# Patient Record
Sex: Female | Born: 2000 | Race: Black or African American | Hispanic: No | Marital: Single | Smoking: Never smoker
Health system: Southern US, Community
[De-identification: ages and names within clinical notes are randomized; demographics above are authoritative.]

## PROBLEM LIST (undated history)

## (undated) ENCOUNTER — Emergency Department (HOSPITAL_COMMUNITY): Discharge status: Absent without leave | Payer: Medicaid Other

## (undated) DIAGNOSIS — I10 Essential (primary) hypertension: Secondary | ICD-10-CM

## (undated) DIAGNOSIS — R040 Epistaxis: Secondary | ICD-10-CM

## (undated) HISTORY — DX: Essential (primary) hypertension: I10

---
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
## 2019-05-06 ENCOUNTER — Ambulatory Visit: Payer: Medicaid Other | Attending: Internal Medicine

## 2019-05-06 DIAGNOSIS — Z23 Encounter for immunization: Secondary | ICD-10-CM

## 2019-05-06 NOTE — Progress Notes (Signed)
   Covid-19 Vaccination Clinic  Name:  Danielle Dunlap    MRN: [id number] DOB: 2000-12-27  05/06/2019  [name] was observed post Covid-19 immunization for 15 minutes without incident. She was provided with Vaccine Information Sheet and instruction to access the V-Safe system.   [name] was instructed to call 911 with any severe reactions post vaccine: Marland Kitchen Difficulty breathing  . Swelling of face and throat  . A fast heartbeat  . A bad rash all over body  . Dizziness and weakness   Immunizations Administered    Name Date Dose VIS Date Route   Pfizer COVID-19 Vaccine 05/06/2019  2:59 PM 0.3 mL 01/21/2019 Intramuscular   Manufacturer: [employer], [employer]   Lot: [id number]   NDC: [address]-9

## 2019-05-31 ENCOUNTER — Ambulatory Visit: Payer: Medicaid Other | Attending: Internal Medicine

## 2019-05-31 DIAGNOSIS — Z23 Encounter for immunization: Secondary | ICD-10-CM

## 2019-05-31 NOTE — Progress Notes (Signed)
   Covid-19 Vaccination Clinic  Name:  Danielle Dunlap    MRN: [id number] DOB: [birth year]  05/31/2019  [name] was observed post Covid-19 immunization for 15 minutes without incident. She was provided with Vaccine Information Sheet and instruction to access the V-Safe system.   [name] was instructed to call 911 with any severe reactions post vaccine: Marland Kitchen Difficulty breathing  . Swelling of face and throat  . A fast heartbeat  . A bad rash all over body  . Dizziness and weakness   Immunizations Administered    Name Date Dose VIS Date Route   Pfizer COVID-19 Vaccine 05/31/2019  3:17 PM 0.3 mL 04/06/2018 Intramuscular   Manufacturer: [employer], [employer]   Lot: [id number]   NDC: [address]-2

## 2022-07-09 ENCOUNTER — Ambulatory Visit (HOSPITAL_COMMUNITY)
Admission: EM | Admit: 2022-07-09 | Discharge: 2022-07-09 | Disposition: A | Discharge status: Home / Self Care | Payer: Commercial Managed Care - PPO | Attending: Internal Medicine | Admitting: Internal Medicine

## 2022-07-09 ENCOUNTER — Encounter (HOSPITAL_COMMUNITY): Payer: Self-pay

## 2022-07-09 DIAGNOSIS — R030 Elevated blood-pressure reading, without diagnosis of hypertension: Secondary | ICD-10-CM

## 2022-07-09 DIAGNOSIS — U071 COVID-19: Secondary | ICD-10-CM | POA: Diagnosis not present

## 2022-07-09 MED ORDER — GUAIFENESIN ER 1200 MG PO TB12: 1200.0000 mg | ORAL_TABLET | Freq: Two times a day (BID) | ORAL | 0 refills | Status: DC

## 2022-07-09 MED ORDER — BENZONATATE 100 MG PO CAPS: 100.0000 mg | ORAL_CAPSULE | Freq: Three times a day (TID) | ORAL | 0 refills | Status: DC

## 2022-07-09 MED ORDER — MOLNUPIRAVIR EUA 200MG CAPSULE: 4.0000 | ORAL_CAPSULE | Freq: Two times a day (BID) | ORAL | 0 refills | Status: AC

## 2022-07-09 NOTE — Discharge Instructions (Signed)
You tested positive for COVID-19 this morning. No need to quarantine, however you may return to work once you have been fever free for 24 hours and once you are feeling better. Your work note to return on June 1 is at the back of your packet.  Take molnupiravir antiviral as prescribed.  This will help to lessen the severity and length of your symptoms.  Use the following medicines to help with symptoms: - Plain Mucinex (guaifenesin) over the counter as directed every 12 hours to thin mucous so that you are able to get it out of your body easier. Drink plenty of water while taking this medication so that it works well in your body (at least 8 cups a day).  - Tylenol 1,000mg  and/or ibuprofen 600mg  every 6 hours with food as needed for aches/pains or fever/chills.  - Tessalon perles every 8 hours as needed for cough.  1 tablespoon of honey in warm water and/or salt water gargles may also help with symptoms. Humidifier to your room will help add water to the air and reduce coughing.  If you develop any new or worsening symptoms, please return.  If your symptoms are severe, please go to the emergency room.  Follow-up with your primary care provider for further evaluation and management of your symptoms as well as ongoing wellness visits.  I hope you feel better!

## 2022-07-09 NOTE — ED Triage Notes (Signed)
Pt c/o nasal congestion  Reports covid(+) test at home early this morning

## 2022-07-09 NOTE — ED Provider Notes (Signed)
[facility]    CSN: [id number] Arrival date & time: 07/09/22  1938      History   Chief Complaint Chief Complaint  Patient presents with   Covid Positive    HPI Danielle Dunlap is a 22 y.o. female.   Patient presents to urgent care for evaluation of nasal congestion, generalized fatigue, and fever/chills that started yesterday.  Max temp at home was 99.8 per patient's mother at the bedside.  She took a COVID-19 test at home this morning and it was positive.  No recent known sick contacts with similar symptoms.  Denies history of COVID-19 infection in the last 90 days to her knowledge.  She is not currently experiencing any cough, shortness of breath, chest pain, nausea, vomiting, diarrhea, abdominal pain, rash, sore throat, ear pain, headache, or decreased appetite.  Never smoker, denies drug use.  Denies history of chronic respiratory problems and chronic medical problems.  Denies chance of pregnancy last menstrual cycle was this month.  She has been taking ibuprofen over-the-counter regularly to help with fever, chills, and inflammation related to viral illness.   Patient's blood pressure is very elevated at 170/120 on initial check 182/110 (manual blood pressure) on recheck.  She is not experiencing headache, dizziness, chest pain, shortness of breath, paresthesias, unilateral weakness, abdominal pain, dizziness, vision changes, or photophobia.  She has never had an MI or stroke in the past.  Reports family history of high blood pressure.  She has had elevated blood pressure readings in the past, however never this high.  Mom is very concerned about elevated blood pressure and states that when she had COVID-19 "her blood pressure also shot up".  Mom is requesting assistance in finding a primary care provider for patient as she has not had a primary care visit in 5 years for routine screening testing.      Past Medical History:  Diagnosis Date   Nosebleed     Patient Active  Problem List   Diagnosis Date Noted   Irregular menses 09/13/2014    Past Surgical History:  Procedure Laterality Date   TYMPANOSTOMY TUBE PLACEMENT  2006    OB History     Gravida  0   Para  0   Term  0   Preterm  0   AB  0   Living  0      SAB  0   IAB  0   Ectopic  0   Multiple  0   Live Births  0            Home Medications    Prior to Admission medications   Medication Sig Start Date End Date Taking? Authorizing Provider  ibuprofen (ADVIL,MOTRIN) 400 MG tablet Take 400 mg by mouth every 6 (six) hours as needed for moderate pain.    [provider]  norgestimate-ethinyl estradiol (ORTHO-CYCLEN,SPRINTEC,PREVIFEM) 0.25-35 MG-MCG tablet Take 1 tablet by mouth daily. Take 2 active pills daily for first week, then one active pill daily for the rest of three packs. 03/10/17   Tereso Newcomer, MD    Family History History reviewed. No pertinent family history.  Social History Social History   Tobacco Use   Smoking status: Never   Smokeless tobacco: Never  Substance Use Topics   Alcohol use: No   Drug use: No     Allergies   Patient has no known allergies.   Review of Systems Review of Systems Per HPI  Physical Exam Triage Vital  Signs ED Triage Vitals  Enc Vitals Group     BP 07/09/22 1951 (!) 170/120     Pulse Rate 07/09/22 1950 94     Resp 07/09/22 1950 18     Temp 07/09/22 1950 98.2 F (36.8 C)     Temp Source 07/09/22 1950 Oral     SpO2 07/09/22 1950 97 %     Weight --      Height --      Head Circumference --      Peak Flow --      Pain Score 07/09/22 1950 0     Pain Loc --      Pain Edu? --      Excl. in GC? --    No data found.  Updated Vital Signs BP (!) 170/120   Pulse 94   Temp 98.2 F (36.8 C) (Oral)   Resp 18   SpO2 97%   Visual Acuity Right Eye Distance:   Left Eye Distance:   Bilateral Distance:    Right Eye Near:   Left Eye Near:    Bilateral Near:     Physical Exam Vitals and nursing  note reviewed.  Constitutional:      Appearance: She is not ill-appearing or toxic-appearing.  HENT:     Head: Normocephalic and atraumatic.     Right Ear: Hearing and external ear normal.     Left Ear: Hearing and external ear normal.     Nose: Nose normal.     Mouth/Throat:     Lips: Pink.  Eyes:     General: Lids are normal. Vision grossly intact. Gaze aligned appropriately.     Extraocular Movements: Extraocular movements intact.     Conjunctiva/sclera: Conjunctivae normal.  Cardiovascular:     Rate and Rhythm: Normal rate and regular rhythm.     Heart sounds: Normal heart sounds, S1 normal and S2 normal.  Pulmonary:     Effort: Pulmonary effort is normal. No respiratory distress.     Breath sounds: Normal breath sounds and air entry.  Musculoskeletal:     Cervical back: Neck supple.  Skin:    General: Skin is warm and dry.     Capillary Refill: Capillary refill takes less than 2 seconds.     Findings: No rash.  Neurological:     General: No focal deficit present.     Mental Status: She is alert and oriented to person, place, and time. Mental status is at baseline.     Cranial Nerves: No dysarthria or facial asymmetry.  Psychiatric:        Mood and Affect: Mood normal.        Speech: Speech normal.        Behavior: Behavior normal.        Thought Content: Thought content normal.        Judgment: Judgment normal.      UC Treatments / Results  Labs (all labs ordered are listed, but only abnormal results are displayed) Labs Reviewed - No data to display  EKG   Radiology No results found.  Procedures Procedures (including critical care time)  Medications Ordered in UC Medications - No data to display  Initial Impression / Assessment and Plan / UC Course  I have reviewed the triage vital signs and the nursing notes.  Pertinent labs & imaging results that were available during my care of the patient were reviewed by me and considered in my medical decision  making (see chart for  details).     *** Final Clinical Impressions(s) / UC Diagnoses   Final diagnoses:  None   Discharge Instructions   None    ED Prescriptions   None    PDMP not reviewed this encounter.

## 2022-07-23 ENCOUNTER — Other Ambulatory Visit: Payer: Self-pay

## 2022-07-23 ENCOUNTER — Emergency Department (HOSPITAL_COMMUNITY): Payer: Commercial Managed Care - PPO

## 2022-07-23 ENCOUNTER — Emergency Department (HOSPITAL_COMMUNITY)
Admission: EM | Admit: 2022-07-23 | Discharge: 2022-07-23 | Disposition: A | Discharge status: Home / Self Care | Payer: Commercial Managed Care - PPO | Attending: Emergency Medicine | Admitting: Emergency Medicine

## 2022-07-23 DIAGNOSIS — R42 Dizziness and giddiness: Secondary | ICD-10-CM | POA: Insufficient documentation

## 2022-07-23 DIAGNOSIS — I10 Essential (primary) hypertension: Secondary | ICD-10-CM | POA: Diagnosis present

## 2022-07-23 DIAGNOSIS — Z79899 Other long term (current) drug therapy: Secondary | ICD-10-CM | POA: Insufficient documentation

## 2022-07-23 DIAGNOSIS — Z8616 Personal history of COVID-19: Secondary | ICD-10-CM | POA: Diagnosis not present

## 2022-07-23 LAB — COMPREHENSIVE METABOLIC PANEL
ALT: 18 U/L (ref 0–44)
AST: 21 U/L (ref 15–41)
Albumin: 3.6 g/dL (ref 3.5–5.0)
Alkaline Phosphatase: 79 U/L (ref 38–126)
Anion gap: 12 (ref 5–15)
BUN: 16 mg/dL (ref 6–20)
CO2: 23 mmol/L (ref 22–32)
Calcium: 9.3 mg/dL (ref 8.9–10.3)
Chloride: 100 mmol/L (ref 98–111)
Creatinine, Ser: 0.83 mg/dL (ref 0.44–1.00)
GFR, Estimated: >60 mL/min (ref >60)
Glucose, Bld: 87 mg/dL (ref 70–99)
Potassium: 3.3 mmol/L — ABNORMAL LOW (ref 3.5–5.1)
Sodium: 135 mmol/L (ref 135–145)
Total Bilirubin: 0.5 mg/dL (ref 0.3–1.2)
Total Protein: 7.5 g/dL (ref 6.5–8.1)

## 2022-07-23 LAB — CBC WITH DIFFERENTIAL/PLATELET
Abs Immature Granulocytes: 0.01 10*3/uL (ref 0.00–0.07)
Basophils Absolute: 0 10*3/uL (ref 0.0–0.1)
Basophils Relative: 1 %
Eosinophils Absolute: 0.2 10*3/uL (ref 0.0–0.5)
Eosinophils Relative: 2 %
HCT: 35.3 % — ABNORMAL LOW (ref 36.0–46.0)
Hemoglobin: 9.9 g/dL — ABNORMAL LOW (ref 12.0–15.0)
Immature Granulocytes: 0 %
Lymphocytes Relative: 40 %
Lymphs Abs: 3.1 10*3/uL (ref 0.7–4.0)
MCH: 19.4 pg — ABNORMAL LOW (ref 26.0–34.0)
MCHC: 28 g/dL — ABNORMAL LOW (ref 30.0–36.0)
MCV: 69.4 fL — ABNORMAL LOW (ref 80.0–100.0)
Monocytes Absolute: 0.7 10*3/uL (ref 0.1–1.0)
Monocytes Relative: 8 %
Neutro Abs: 3.8 10*3/uL (ref 1.7–7.7)
Neutrophils Relative %: 49 %
Platelets: 237 10*3/uL (ref 150–400)
RBC: 5.09 MIL/uL (ref 3.87–5.11)
RDW: 18 % — ABNORMAL HIGH (ref 11.5–15.5)
WBC: 7.8 10*3/uL (ref 4.0–10.5)
nRBC: 0 % (ref 0.0–0.2)

## 2022-07-23 LAB — I-STAT BETA HCG BLOOD, ED (MC, WL, AP ONLY): I-stat hCG, quantitative: <5 m[IU]/mL (ref <5)

## 2022-07-23 LAB — MAGNESIUM: Magnesium: 1.9 mg/dL (ref 1.7–2.4)

## 2022-07-23 MED ORDER — AMLODIPINE BESYLATE 5 MG PO TABS
5.0000 mg | ORAL_TABLET | Freq: Once | ORAL | Status: AC
  Administered 2022-07-23: 5 mg via ORAL
  Filled 2022-07-23: qty 1

## 2022-07-23 MED ORDER — METOPROLOL SUCCINATE ER 25 MG PO TB24: 25.0000 mg | ORAL_TABLET | Freq: Every day | ORAL | 2 refills | Status: DC

## 2022-07-23 MED ORDER — AMLODIPINE BESYLATE 5 MG PO TABS: 5.0000 mg | ORAL_TABLET | Freq: Every day | ORAL | 2 refills | Status: DC

## 2022-07-23 MED ORDER — METOPROLOL TARTRATE 25 MG PO TABS
25.0000 mg | ORAL_TABLET | Freq: Once | ORAL | Status: AC
  Administered 2022-07-23: 25 mg via ORAL
  Filled 2022-07-23: qty 1

## 2022-07-23 MED ORDER — POTASSIUM CHLORIDE CRYS ER 20 MEQ PO TBCR
40.0000 meq | EXTENDED_RELEASE_TABLET | Freq: Once | ORAL | Status: AC
  Administered 2022-07-23: 40 meq via ORAL
  Filled 2022-07-23: qty 2

## 2022-07-23 NOTE — Discharge Instructions (Signed)
There were new medications sent to your pharmacy to treat hypertension.  Take these daily as prescribed and continue to monitor your blood pressure at home.  If blood pressure drops below 120/80, discontinue the amlodipine.  If blood pressure remains elevated despite these medications, discuss further with your primary care doctor for changes in dosing.

## 2022-07-23 NOTE — ED Provider Notes (Signed)
[place] EMERGENCY DEPARTMENT AT [facility] Provider Note   CSN: [id number] Arrival date & time: 07/23/22  0252     History  Chief Complaint  Patient presents with   Hypertension    Danielle Dunlap is a 22 y.o. female.   Hypertension  Patient presents for hypertension.  She has no known chronic medical conditions.  She was seen in urgent care several weeks ago and diagnosed with COVID-19.  At the time, blood pressure was elevated in the range of 170/120.  Since that time, patient has been checking her blood pressure at home.  This is typically done on a wrist cuff blood pressure device.  Patient has had persistent hypertension since her recent urgent care visit.  She currently does not have a primary care doctor but does have an appointment scheduled to establish 1 in 1 month.  Today, blood pressure was more elevated than it has been.  This prompted her to come to the ED.  Patient reports intermittent episodes of mild dizziness with standing.  She denies any other recent symptoms.     Home Medications Prior to Admission medications   Medication Sig Start Date End Date Taking? Authorizing Provider  amLODipine (NORVASC) 5 MG tablet Take 1 tablet (5 mg total) by mouth daily. 07/23/22  Yes Gloris Manchester, MD  metoprolol succinate (TOPROL-XL) 25 MG 24 hr tablet Take 1 tablet (25 mg total) by mouth daily. 07/23/22  Yes Gloris Manchester, MD  benzonatate (TESSALON) 100 MG capsule Take 1 capsule (100 mg total) by mouth every 8 (eight) hours. 07/09/22   Carlisle Beers, FNP  Guaifenesin 1200 MG TB12 Take 1 tablet (1,200 mg total) by mouth in the morning and at bedtime. 07/09/22   Carlisle Beers, FNP  ibuprofen (ADVIL,MOTRIN) 400 MG tablet Take 400 mg by mouth every 6 (six) hours as needed for moderate pain.    [provider]  norgestimate-ethinyl estradiol (ORTHO-CYCLEN,SPRINTEC,PREVIFEM) 0.25-35 MG-MCG tablet Take 1 tablet by mouth daily. Take 2 active pills daily for first  week, then one active pill daily for the rest of three packs. 03/10/17   Tereso Newcomer, MD      Allergies    Patient has no known allergies.    Review of Systems   Review of Systems  Neurological:  Positive for dizziness.  All other systems reviewed and are negative.   Physical Exam Updated Vital Signs BP (!) 149/93   Pulse 95   Temp 97.6 F (36.4 C) (Oral)   Resp (!) 22   SpO2 97%  Physical Exam Vitals and nursing note reviewed.  Constitutional:      General: She is not in acute distress.    Appearance: Normal appearance. She is well-developed. She is not ill-appearing, toxic-appearing or diaphoretic.  HENT:     Head: Normocephalic and atraumatic.     Right Ear: External ear normal.     Left Ear: External ear normal.     Nose: Nose normal.  Eyes:     Extraocular Movements: Extraocular movements intact.     Conjunctiva/sclera: Conjunctivae normal.  Cardiovascular:     Rate and Rhythm: Normal rate and regular rhythm.     Heart sounds: No murmur heard. Pulmonary:     Effort: Pulmonary effort is normal. No respiratory distress.     Breath sounds: Normal breath sounds. No wheezing, rhonchi or rales.  Abdominal:     General: There is no distension.     Palpations: Abdomen is soft.  Tenderness: There is no abdominal tenderness.  Musculoskeletal:        General: No swelling. Normal range of motion.     Cervical back: Normal range of motion and neck supple.     Right lower leg: No edema.     Left lower leg: No edema.  Skin:    General: Skin is warm and dry.     Capillary Refill: Capillary refill takes less than 2 seconds.     Coloration: Skin is not jaundiced or pale.  Neurological:     General: No focal deficit present.     Mental Status: She is alert.     Cranial Nerves: No cranial nerve deficit.     Sensory: No sensory deficit.     Motor: No weakness.     Coordination: Coordination normal.  Psychiatric:        Mood and Affect: Mood normal.         Behavior: Behavior normal.        Thought Content: Thought content normal.        Judgment: Judgment normal.     ED Results / Procedures / Treatments   Labs (all labs ordered are listed, but only abnormal results are displayed) Labs Reviewed  CBC WITH DIFFERENTIAL/PLATELET - Abnormal; Notable for the following components:      Result Value   Hemoglobin 9.9 (*)    HCT 35.3 (*)    MCV 69.4 (*)    MCH 19.4 (*)    MCHC 28.0 (*)    RDW 18.0 (*)    All other components within normal limits  COMPREHENSIVE METABOLIC PANEL - Abnormal; Notable for the following components:   Potassium 3.3 (*)    All other components within normal limits  MAGNESIUM  I-STAT BETA HCG BLOOD, ED ([facility], [facility], [facility] ONLY)    EKG EKG Interpretation  Date/Time:  Wednesday July 23 2022 03:02:13 EDT Ventricular Rate:  92 PR Interval:  154 QRS Duration: 90 QT Interval:  358 QTC Calculation: 442 R Axis:   90 Text Interpretation: Normal sinus rhythm Rightward axis Confirmed by Gloris Manchester [phone]) on 07/23/2022 5:14:29 AM  Radiology DG Chest Portable 1 View  Result Date: 07/23/2022 CLINICAL DATA:  22 year old female with hypertension. EXAM: PORTABLE CHEST 1 VIEW COMPARISON:  None Available. FINDINGS: Portable AP upright view at 0529 hours. Large body habitus. Lung volumes and mediastinal contours are within normal limits. Visualized tracheal air column is within normal limits. Allowing for portable technique the lungs are clear. No pneumothorax or pleural effusion. No osseous abnormality identified. Paucity of bowel gas in the visible abdomen. IMPRESSION: Negative portable chest. Electronically Signed   By: Odessa Fleming M.D.   On: 07/23/2022 05:59    Procedures Procedures    Medications Ordered in ED Medications  potassium chloride SA (KLOR-CON M) CR tablet 40 mEq (40 mEq Oral Given 07/23/22 0541)  amLODipine (NORVASC) tablet 5 mg (5 mg Oral Given 07/23/22 0538)  metoprolol tartrate (LOPRESSOR) tablet 25 mg (25 mg Oral  Given 07/23/22 8119)    ED Course/ Medical Decision Making/ A&P                             Medical Decision Making Amount and/or Complexity of Data Reviewed Labs: ordered. Radiology: ordered.  Risk Prescription drug management.   Patient presenting for asymptomatic hypertension.  On initial exam, patient is well-appearing.  Her breathing is unlabored.  Although she does endorse some intermittent  episodes of dizziness with standing, she denies any currently.  She has no focal neurologic deficits on exam.  Blood pressure at this time is 172/110.  Workup was initiated. Patient was started on amlodipine and metoprolol.  She had improved blood pressure while in the ED.  Workup results are reassuring and do not show any evidence of endorgan damage.  Patient was prescribed blood pressure medications and advised to continue to monitor blood pressure at home.  She was discharged in good condition.        Final Clinical Impression(s) / ED Diagnoses Final diagnoses:  Hypertension, unspecified type    Rx / DC Orders ED Discharge Orders          Ordered    amLODipine (NORVASC) 5 MG tablet  Daily        07/23/22 0634    metoprolol succinate (TOPROL-XL) 25 MG 24 hr tablet  Daily        07/23/22 0634              Gloris Manchester, MD 07/23/22 [phone]

## 2022-07-23 NOTE — ED Triage Notes (Addendum)
Patient reports elevated blood pressure at home BP=180+/100+ this evening with mild dizziness , denies SOB .

## 2022-12-01 ENCOUNTER — Ambulatory Visit: Payer: Commercial Managed Care - PPO | Admitting: Family Medicine

## 2022-12-03 ENCOUNTER — Ambulatory Visit (INDEPENDENT_AMBULATORY_CARE_PROVIDER_SITE_OTHER): Payer: Commercial Managed Care - PPO | Admitting: Nurse Practitioner

## 2022-12-03 ENCOUNTER — Encounter: Payer: Self-pay | Admitting: Nurse Practitioner

## 2022-12-03 VITALS — BP 152/97 | HR 88 | Temp 97.2°F | Ht 62.99 in | Wt 285.0 lb

## 2022-12-03 DIAGNOSIS — D224 Melanocytic nevi of scalp and neck: Secondary | ICD-10-CM

## 2022-12-03 DIAGNOSIS — I10 Essential (primary) hypertension: Secondary | ICD-10-CM

## 2022-12-03 MED ORDER — METOPROLOL SUCCINATE ER 25 MG PO TB24: 25.0000 mg | ORAL_TABLET | Freq: Every day | ORAL | 2 refills | Status: DC

## 2022-12-03 MED ORDER — AMLODIPINE BESYLATE 5 MG PO TABS: 5.0000 mg | ORAL_TABLET | Freq: Every day | ORAL | 2 refills | Status: DC

## 2022-12-03 MED ORDER — AMLODIPINE BESYLATE 5 MG PO TABS
5.0000 mg | ORAL_TABLET | Freq: Every day | ORAL | 2 refills | Status: DC
Start: 2022-12-03 — End: 2022-12-03

## 2022-12-03 NOTE — Progress Notes (Unsigned)
   Subjective   Patient ID: Danielle Dunlap, female    DOB: 01-May-2000, 22 y.o.   MRN: [id number]  Chief Complaint  Patient presents with   Establish Care   Hypertension    Since May     Referring provider: No ref. provider found  Danielle Dunlap is a 22 y.o. female with Past Medical History: No date: Nosebleed   HPI  Does have ob gyn      No Known Allergies  Immunization History  Administered Date(s) Administered   HPV 9-valent 10/23/2016, 03/10/2017, 05/13/2017, 06/09/2017   Influenza,inj,Quad PF,6+ Mos 10/23/2016   PFIZER(Purple Top)SARS-COV-2 Vaccination 05/06/2019, 05/31/2019    Tobacco History: Social History   Tobacco Use  Smoking Status Never  Smokeless Tobacco Never   Counseling given: Not Answered   Outpatient Encounter Medications as of 12/03/2022  Medication Sig   ibuprofen (ADVIL,MOTRIN) 400 MG tablet Take 400 mg by mouth every 6 (six) hours as needed for moderate pain.   norgestimate-ethinyl estradiol (ORTHO-CYCLEN,SPRINTEC,PREVIFEM) 0.25-35 MG-MCG tablet Take 1 tablet by mouth daily. Take 2 active pills daily for first week, then one active pill daily for the rest of three packs.   [DISCONTINUED] amLODipine (NORVASC) 5 MG tablet Take 1 tablet (5 mg total) by mouth daily.   [DISCONTINUED] metoprolol succinate (TOPROL-XL) 25 MG 24 hr tablet Take 1 tablet (25 mg total) by mouth daily.   amLODipine (NORVASC) 5 MG tablet Take 1 tablet (5 mg total) by mouth daily.   metoprolol succinate (TOPROL-XL) 25 MG 24 hr tablet Take 1 tablet (25 mg total) by mouth daily.   [DISCONTINUED] benzonatate (TESSALON) 100 MG capsule Take 1 capsule (100 mg total) by mouth every 8 (eight) hours. (Patient not taking: Reported on 12/03/2022)   [DISCONTINUED] Guaifenesin 1200 MG TB12 Take 1 tablet (1,200 mg total) by mouth in the morning and at bedtime.   No facility-administered encounter medications on file as of 12/03/2022.    Review of Systems  Review of Systems    Objective:   BP (!) 160/113   Pulse 88   Temp (!) 97.2 F (36.2 C) (Temporal)   Ht 5' 2.99" (1.6 m)   Wt 285 lb (129.3 kg)   SpO2 98%   BMI 50.50 kg/m   Wt Readings from Last 5 Encounters:  12/03/22 285 lb (129.3 kg)  04/07/17 259 lb 12.8 oz (117.8 kg) (>99%, Z= 2.53)*  03/10/17 266 lb (120.7 kg) (>99%, Z= 2.57)*  10/23/16 269 lb (122 kg) (>99%, Z= 2.57)*  01/19/16 254 lb (115.2 kg) (>99%, Z= 2.48)*   * Growth percentiles are based on CDC (Girls, 2-20 Years) data.     Physical Exam  {Labs (Optional):23779}  Assessment & Plan:   Primary hypertension -     amLODIPine Besylate; Take 1 tablet (5 mg total) by mouth daily.  Dispense: 30 tablet; Refill: 2 -     Metoprolol Succinate ER; Take 1 tablet (25 mg total) by mouth daily.  Dispense: 30 tablet; Refill: 2 -     CBC -     Comprehensive metabolic panel -     Lipid panel     No follow-ups on file.   Ivonne Andrew, NP 12/03/2022

## 2022-12-03 NOTE — Patient Instructions (Signed)
DASH Eating Plan DASH stands for Dietary Approaches to Stop Hypertension. The DASH eating plan is a healthy eating plan that has been shown to: Lower high blood pressure (hypertension). Reduce your risk for type 2 diabetes, heart disease, and stroke. Help with weight loss. What are tips for following this plan? Reading food labels Check food labels for the amount of salt (sodium) per serving. Choose foods with less than 5 percent of the Daily Value (DV) of sodium. In general, foods with less than 300 milligrams (mg) of sodium per serving fit into this eating plan. To find whole grains, look for the word "whole" as the first word in the ingredient list. Shopping Buy products labeled as "low-sodium" or "no salt added." Buy fresh foods. Avoid canned foods and pre-made or frozen meals. Cooking Try not to add salt when you cook. Use salt-free seasonings or herbs instead of table salt or sea salt. Check with your health care provider or pharmacist before using salt substitutes. Do not fry foods. Cook foods in healthy ways, such as baking, boiling, grilling, roasting, or broiling. Cook using oils that are good for your heart. These include olive, canola, avocado, soybean, and sunflower oil. Meal planning  Eat a balanced diet. This should include: 4 or more servings of fruits and 4 or more servings of vegetables each day. Try to fill half of your plate with fruits and vegetables. 6-8 servings of whole grains each day. 6 or less servings of lean meat, poultry, or fish each day. 1 oz is 1 serving. A 3 oz (85 g) serving of meat is about the same size as the palm of your hand. One egg is 1 oz (28 g). 2-3 servings of low-fat dairy each day. One serving is 1 cup (237 mL). 1 serving of nuts, seeds, or beans 5 times each week. 2-3 servings of heart-healthy fats. Healthy fats called omega-3 fatty acids are found in foods such as walnuts, flaxseeds, fortified milks, and eggs. These fats are also found in  cold-water fish, such as sardines, salmon, and mackerel. Limit how much you eat of: Canned or prepackaged foods. Food that is high in trans fat, such as fried foods. Food that is high in saturated fat, such as fatty meat. Desserts and other sweets, sugary drinks, and other foods with added sugar. Full-fat dairy products. Do not salt foods before eating. Do not eat more than 4 egg yolks a week. Try to eat at least 2 vegetarian meals a week. Eat more home-cooked food and less restaurant, buffet, and fast food. Lifestyle When eating at a restaurant, ask if your food can be made with less salt or no salt. If you drink alcohol: Limit how much you have to: 0-1 drink a day if you are female. 0-2 drinks a day if you are female. Know how much alcohol is in your drink. In the [place] one drink is one 12 oz bottle of beer (355 mL), one 5 oz glass of wine (148 mL), or one 1 oz glass of hard liquor (44 mL). General information Avoid eating more than 2,300 mg of salt a day. If you have hypertension, you may need to reduce your sodium intake to 1,500 mg a day. Work with your provider to stay at a healthy body weight or lose weight. Ask what the best weight range is for you. On most days of the week, get at least 30 minutes of exercise that causes your heart to beat faster. This may include walking, swimming, or  biking. Work with your provider or dietitian to adjust your eating plan to meet your specific calorie needs. What foods should I eat? Fruits All fresh, dried, or frozen fruit. Canned fruits that are in their natural juice and do not have sugar added to them. Vegetables Fresh or frozen vegetables that are raw, steamed, roasted, or grilled. Low-sodium or reduced-sodium tomato and vegetable juice. Low-sodium or reduced-sodium tomato sauce and tomato paste. Low-sodium or reduced-sodium canned vegetables. Grains Whole-grain or whole-wheat bread. Whole-grain or whole-wheat pasta. Brown rice. Orpah Cobb. Bulgur. Whole-grain and low-sodium cereals. Pita bread. Low-fat, low-sodium crackers. Whole-wheat flour tortillas. Meats and other proteins Skinless chicken or [place]. Ground chicken or [place]. Pork with fat trimmed off. Fish and seafood. Egg whites. Dried beans, peas, or lentils. Unsalted nuts, nut butters, and seeds. Unsalted canned beans. Lean cuts of beef with fat trimmed off. Low-sodium, lean precooked or cured meat, such as sausages or meat loaves. Dairy Low-fat (1%) or fat-free (skim) milk. Reduced-fat, low-fat, or fat-free cheeses. Nonfat, low-sodium ricotta or cottage cheese. Low-fat or nonfat yogurt. Low-fat, low-sodium cheese. Fats and oils Soft margarine without trans fats. Vegetable oil. Reduced-fat, low-fat, or light mayonnaise and salad dressings (reduced-sodium). Canola, safflower, olive, avocado, soybean, and sunflower oils. Avocado. Seasonings and condiments Herbs. Spices. Seasoning mixes without salt. Other foods Unsalted popcorn and pretzels. Fat-free sweets. The items listed above may not be all the foods and drinks you can have. Talk to a dietitian to learn more. What foods should I avoid? Fruits Canned fruit in a light or heavy syrup. Fried fruit. Fruit in cream or butter sauce. Vegetables Creamed or fried vegetables. Vegetables in a cheese sauce. Regular canned vegetables that are not marked as low-sodium or reduced-sodium. Regular canned tomato sauce and paste that are not marked as low-sodium or reduced-sodium. Regular tomato and vegetable juices that are not marked as low-sodium or reduced-sodium. Rosita Fire. Olives. Grains Baked goods made with fat, such as croissants, muffins, or some breads. Dry pasta or rice meal packs. Meats and other proteins Fatty cuts of meat. Ribs. Fried meat. Tomasa Blase. Bologna, salami, and other precooked or cured meats, such as sausages or meat loaves, that are not lean and low in sodium. Fat from the back of a pig (fatback). Bratwurst.  Salted nuts and seeds. Canned beans with added salt. Canned or smoked fish. Whole eggs or egg yolks. Chicken or [place] with skin. Dairy Whole or 2% milk, cream, and half-and-half. Whole or full-fat cream cheese. Whole-fat or sweetened yogurt. Full-fat cheese. Nondairy creamers. Whipped toppings. Processed cheese and cheese spreads. Fats and oils Butter. Stick margarine. Lard. Shortening. Ghee. Bacon fat. Tropical oils, such as coconut, palm kernel, or palm oil. Seasonings and condiments Onion salt, garlic salt, seasoned salt, table salt, and sea salt. Worcestershire sauce. Tartar sauce. Barbecue sauce. Teriyaki sauce. Soy sauce, including reduced-sodium soy sauce. Steak sauce. Canned and packaged gravies. Fish sauce. Oyster sauce. Cocktail sauce. Store-bought horseradish. Ketchup. Mustard. Meat flavorings and tenderizers. Bouillon cubes. Hot sauces. Pre-made or packaged marinades. Pre-made or packaged taco seasonings. Relishes. Regular salad dressings. Other foods Salted popcorn and pretzels. The items listed above may not be all the foods and drinks you should avoid. Talk to a dietitian to learn more. Where to find more information National Heart, Lung, and Blood Institute (NHLBI): BuffaloDryCleaner.gl American Heart Association (AHA): heart.org Academy of Nutrition and Dietetics: eatright.org National Kidney Foundation (NKF): kidney.org This information is not intended to replace advice given to you by your health care provider. Make sure  you discuss any questions you have with your health care provider. Document Revised: 02/13/2022 Document Reviewed: 02/13/2022 Elsevier Patient Education  2024 Elsevier Inc. Hypertension, Adult Hypertension is another name for high blood pressure. High blood pressure forces your heart to work harder to pump blood. This can cause problems over time. There are two numbers in a blood pressure reading. There is a top number (systolic) over a bottom number (diastolic). It  is best to have a blood pressure that is below 120/80. What are the causes? The cause of this condition is not known. Some other conditions can lead to high blood pressure. What increases the risk? Some lifestyle factors can make you more likely to develop high blood pressure: Smoking. Not getting enough exercise or physical activity. Being overweight. Having too much fat, sugar, calories, or salt (sodium) in your diet. Drinking too much alcohol. Other risk factors include: Having any of these conditions: Heart disease. Diabetes. High cholesterol. Kidney disease. Obstructive sleep apnea. Having a family history of high blood pressure and high cholesterol. Age. The risk increases with age. Stress. What are the signs or symptoms? High blood pressure may not cause symptoms. Very high blood pressure (hypertensive crisis) may cause: Headache. Fast or uneven heartbeats (palpitations). Shortness of breath. Nosebleed. Vomiting or feeling like you may vomit (nauseous). Changes in how you see. Very bad chest pain. Feeling dizzy. Seizures. How is this treated? This condition is treated by making healthy lifestyle changes, such as: Eating healthy foods. Exercising more. Drinking less alcohol. Your doctor may prescribe medicine if lifestyle changes do not help enough and if: Your top number is above 130. Your bottom number is above 80. Your personal target blood pressure may vary. Follow these instructions at home: Eating and drinking  If told, follow the DASH eating plan. To follow this plan: Fill one half of your plate at each meal with fruits and vegetables. Fill one fourth of your plate at each meal with whole grains. Whole grains include whole-wheat pasta, brown rice, and whole-grain bread. Eat or drink low-fat dairy products, such as skim milk or low-fat yogurt. Fill one fourth of your plate at each meal with low-fat (lean) proteins. Low-fat proteins include fish, chicken  without skin, eggs, beans, and tofu. Avoid fatty meat, cured and processed meat, or chicken with skin. Avoid pre-made or processed food. Limit the amount of salt in your diet to less than 1,500 mg each day. Do not drink alcohol if: Your doctor tells you not to drink. You are pregnant, may be pregnant, or are planning to become pregnant. If you drink alcohol: Limit how much you have to: 0-1 drink a day for women. 0-2 drinks a day for men. Know how much alcohol is in your drink. In the [place] one drink equals one 12 oz bottle of beer (355 mL), one 5 oz glass of wine (148 mL), or one 1 oz glass of hard liquor (44 mL). Lifestyle  Work with your doctor to stay at a healthy weight or to lose weight. Ask your doctor what the best weight is for you. Get at least 30 minutes of exercise that causes your heart to beat faster (aerobic exercise) most days of the week. This may include walking, swimming, or biking. Get at least 30 minutes of exercise that strengthens your muscles (resistance exercise) at least 3 days a week. This may include lifting weights or doing Pilates. Do not smoke or use any products that contain nicotine or tobacco. If you need help  quitting, ask your doctor. Check your blood pressure at home as told by your doctor. Keep all follow-up visits. Medicines Take over-the-counter and prescription medicines only as told by your doctor. Follow directions carefully. Do not skip doses of blood pressure medicine. The medicine does not work as well if you skip doses. Skipping doses also puts you at risk for problems. Ask your doctor about side effects or reactions to medicines that you should watch for. Contact a doctor if: You think you are having a reaction to the medicine you are taking. You have headaches that keep coming back. You feel dizzy. You have swelling in your ankles. You have trouble with your vision. Get help right away if: You get a very bad headache. You start to feel  mixed up (confused). You feel weak or numb. You feel faint. You have very bad pain in your: Chest. Belly (abdomen). You vomit more than once. You have trouble breathing. These symptoms may be an emergency. Get help right away. Call 911. Do not wait to see if the symptoms will go away. Do not drive yourself to the hospital. Summary Hypertension is another name for high blood pressure. High blood pressure forces your heart to work harder to pump blood. For most people, a normal blood pressure is less than 120/80. Making healthy choices can help lower blood pressure. If your blood pressure does not get lower with healthy choices, you may need to take medicine. This information is not intended to replace advice given to you by your health care provider. Make sure you discuss any questions you have with your health care provider. Document Revised: 11/15/2020 Document Reviewed: 11/15/2020 Elsevier Patient Education  2024 [employer].

## 2022-12-04 ENCOUNTER — Encounter: Payer: Self-pay | Admitting: Nurse Practitioner

## 2022-12-04 LAB — COMPREHENSIVE METABOLIC PANEL
ALT: 15 [IU]/L (ref 0–32)
AST: 18 [IU]/L (ref 0–40)
Albumin: 4 g/dL (ref 4.0–5.0)
Alkaline Phosphatase: 94 [IU]/L (ref 44–121)
BUN/Creatinine Ratio: 15 (ref 9–23)
BUN: 11 mg/dL (ref 6–20)
Bilirubin Total: <0.2 mg/dL (ref 0.0–1.2)
CO2: 23 mmol/L (ref 20–29)
Calcium: 8.6 mg/dL — ABNORMAL LOW (ref 8.7–10.2)
Chloride: 104 mmol/L (ref 96–106)
Creatinine, Ser: 0.74 mg/dL (ref 0.57–1.00)
Globulin, Total: 3 g/dL (ref 1.5–4.5)
Glucose: 107 mg/dL — ABNORMAL HIGH (ref 70–99)
Potassium: 3.5 mmol/L (ref 3.5–5.2)
Sodium: 141 mmol/L (ref 134–144)
Total Protein: 7 g/dL (ref 6.0–8.5)
eGFR: 116 mL/min/{1.73_m2} (ref >59)

## 2022-12-04 LAB — CBC
Hematocrit: 37.4 % (ref 34.0–46.6)
Hemoglobin: 11.1 g/dL (ref 11.1–15.9)
MCH: 21.5 pg — ABNORMAL LOW (ref 26.6–33.0)
MCHC: 29.7 g/dL — ABNORMAL LOW (ref 31.5–35.7)
MCV: 72 fL — ABNORMAL LOW (ref 79–97)
Platelets: 270 10*3/uL (ref 150–450)
RBC: 5.17 x10E6/uL (ref 3.77–5.28)
RDW: 16.6 % — ABNORMAL HIGH (ref 11.7–15.4)
WBC: 4.9 10*3/uL (ref 3.4–10.8)

## 2022-12-04 LAB — LIPID PANEL
Chol/HDL Ratio: 4.3 ratio (ref 0.0–4.4)
Cholesterol, Total: 187 mg/dL (ref 100–199)
HDL: 43 mg/dL (ref >39)
LDL Chol Calc (NIH): 123 mg/dL — ABNORMAL HIGH (ref 0–99)
Triglycerides: 119 mg/dL (ref 0–149)
VLDL Cholesterol Cal: 21 mg/dL (ref 5–40)

## 2022-12-17 ENCOUNTER — Ambulatory Visit: Payer: Self-pay | Admitting: Nurse Practitioner

## 2022-12-31 ENCOUNTER — Ambulatory Visit: Payer: Commercial Managed Care - PPO | Admitting: Nurse Practitioner

## 2022-12-31 ENCOUNTER — Encounter: Payer: Self-pay | Admitting: Nurse Practitioner

## 2022-12-31 VITALS — BP 136/90 | HR 84 | Temp 98.7°F | Resp 14 | Ht 62.0 in | Wt 276.0 lb

## 2022-12-31 DIAGNOSIS — I10 Essential (primary) hypertension: Secondary | ICD-10-CM | POA: Diagnosis not present

## 2022-12-31 NOTE — Progress Notes (Signed)
   Subjective   Patient ID: Danielle Dunlap, female    DOB: December 17, 2000, 22 y.o.   MRN: [id number]  Chief Complaint  Patient presents with   Hypertension    Referring provider: No ref. provider found  Danielle Dunlap is a 22 y.o. female with Past Medical History: No date: Nosebleed   HPI  Patient is following up on hypertension today.  She is compliant with medications.  Blood pressure stable in office today.  Overall patient is tolerating medications well. Denies f/c/s, n/v/d, hemoptysis, PND, leg swelling Denies chest pain or edema    No Known Allergies  Immunization History  Administered Date(s) Administered   HPV 9-valent 10/23/2016, 03/10/2017, 05/13/2017, 06/09/2017   Influenza,inj,Quad PF,6+ Mos 10/23/2016   PFIZER(Purple Top)SARS-COV-2 Vaccination 05/06/2019, 05/31/2019    Tobacco History: Social History   Tobacco Use  Smoking Status Never  Smokeless Tobacco Never   Counseling given: Not Answered   Outpatient Encounter Medications as of 12/31/2022  Medication Sig   amLODipine (NORVASC) 5 MG tablet Take 1 tablet (5 mg total) by mouth daily.   ibuprofen (ADVIL,MOTRIN) 400 MG tablet Take 400 mg by mouth every 6 (six) hours as needed for moderate pain.   metoprolol succinate (TOPROL-XL) 25 MG 24 hr tablet Take 1 tablet (25 mg total) by mouth daily.   norgestimate-ethinyl estradiol (ORTHO-CYCLEN,SPRINTEC,PREVIFEM) 0.25-35 MG-MCG tablet Take 1 tablet by mouth daily. Take 2 active pills daily for first week, then one active pill daily for the rest of three packs.   No facility-administered encounter medications on file as of 12/31/2022.    Review of Systems  Review of Systems  Constitutional: Negative.   HENT: Negative.    Cardiovascular: Negative.   Gastrointestinal: Negative.   Allergic/Immunologic: Negative.   Neurological: Negative.   Psychiatric/Behavioral: Negative.       Objective:   BP (!) 141/97 (BP Location: Left Arm, Patient Position: Sitting, Cuff  Size: Large)   Pulse 84   Temp 98.7 F (37.1 C)   Resp 14   Ht 5\' 2"  (1.575 m)   Wt 276 lb (125.2 kg)   LMP 12/18/2022   SpO2 99%   BMI 50.48 kg/m   Wt Readings from Last 5 Encounters:  12/31/22 276 lb (125.2 kg)  12/03/22 285 lb (129.3 kg)  04/07/17 259 lb 12.8 oz (117.8 kg) (>99%, Z= 2.53)*  03/10/17 266 lb (120.7 kg) (>99%, Z= 2.57)*  10/23/16 269 lb (122 kg) (>99%, Z= 2.57)*   * Growth percentiles are based on CDC (Girls, 2-20 Years) data.     Physical Exam Vitals and nursing note reviewed.  Constitutional:      General: She is not in acute distress.    Appearance: She is well-developed.  Cardiovascular:     Rate and Rhythm: Normal rate and regular rhythm.  Pulmonary:     Effort: Pulmonary effort is normal.     Breath sounds: Normal breath sounds.  Neurological:     Mental Status: She is alert and oriented to person, place, and time.       Assessment & Plan:   Primary hypertension     Return in about 3 months (around 04/02/2023) for blood pressure.   Ivonne Andrew, NP 12/31/2022

## 2023-02-06 NOTE — Patient Instructions (Signed)
1. Primary hypertension (Primary)

## 2023-04-02 ENCOUNTER — Ambulatory Visit: Payer: Self-pay | Admitting: Nurse Practitioner

## 2023-04-08 ENCOUNTER — Ambulatory Visit (INDEPENDENT_AMBULATORY_CARE_PROVIDER_SITE_OTHER): Payer: BC Managed Care – PPO | Admitting: Nurse Practitioner

## 2023-04-08 ENCOUNTER — Encounter: Payer: Self-pay | Admitting: Nurse Practitioner

## 2023-04-08 DIAGNOSIS — I10 Essential (primary) hypertension: Secondary | ICD-10-CM | POA: Diagnosis not present

## 2023-04-08 MED ORDER — AMLODIPINE BESYLATE 5 MG PO TABS: 5.0000 mg | ORAL_TABLET | Freq: Every day | ORAL | 2 refills | Status: DC

## 2023-04-08 MED ORDER — METOPROLOL SUCCINATE ER 25 MG PO TB24: 25.0000 mg | ORAL_TABLET | Freq: Every day | ORAL | 2 refills | Status: DC

## 2023-04-08 NOTE — Progress Notes (Signed)
 Subjective   Patient ID: Danielle Dunlap, female    DOB: 2000/12/16, 23 y.o.   MRN: [id number]  Chief Complaint  Patient presents with   Hypertension    I need to check on my blood pressure today     Referring provider: No ref. provider found  Danielle Dunlap is a 23 y.o. female with Past Medical History: No date: Nosebleed   HPI  Patient is following up on hypertension today.  She is compliant with medications.  Blood pressure stable in office today.  Overall patient is tolerating medications well. Denies f/c/s, n/v/d, hemoptysis, PND, leg swelling. Denies chest pain or edema.     No Known Allergies  Immunization History  Administered Date(s) Administered   HPV 9-valent 10/23/2016, 03/10/2017, 05/13/2017, 06/09/2017   Influenza,inj,Quad PF,6+ Mos 10/23/2016   PFIZER(Purple Top)SARS-COV-2 Vaccination 05/06/2019, 05/31/2019    Tobacco History: Social History   Tobacco Use  Smoking Status Never  Smokeless Tobacco Never   Counseling given: Not Answered   Outpatient Encounter Medications as of 04/08/2023  Medication Sig   [DISCONTINUED] amLODipine (NORVASC) 5 MG tablet Take 1 tablet (5 mg total) by mouth daily.   [DISCONTINUED] metoprolol succinate (TOPROL-XL) 25 MG 24 hr tablet Take 1 tablet (25 mg total) by mouth daily.   amLODipine (NORVASC) 5 MG tablet Take 1 tablet (5 mg total) by mouth daily.   ibuprofen (ADVIL,MOTRIN) 400 MG tablet Take 400 mg by mouth every 6 (six) hours as needed for moderate pain. (Patient not taking: Reported on 04/08/2023)   metoprolol succinate (TOPROL-XL) 25 MG 24 hr tablet Take 1 tablet (25 mg total) by mouth daily.   norgestimate-ethinyl estradiol (ORTHO-CYCLEN,SPRINTEC,PREVIFEM) 0.25-35 MG-MCG tablet Take 1 tablet by mouth daily. Take 2 active pills daily for first week, then one active pill daily for the rest of three packs. (Patient not taking: Reported on 04/08/2023)   No facility-administered encounter medications on file as of 04/08/2023.     Review of Systems  Review of Systems  Constitutional: Negative.   HENT: Negative.    Cardiovascular: Negative.   Gastrointestinal: Negative.   Allergic/Immunologic: Negative.   Neurological: Negative.   Psychiatric/Behavioral: Negative.       Objective:   BP 123/81   Pulse 75   Temp 97.7 F (36.5 C)   Wt 294 lb 6.4 oz (133.5 kg)   SpO2 100%   BMI 53.85 kg/m   Wt Readings from Last 5 Encounters:  04/08/23 294 lb 6.4 oz (133.5 kg)  12/31/22 276 lb (125.2 kg)  12/03/22 285 lb (129.3 kg)  04/07/17 259 lb 12.8 oz (117.8 kg) (>99%, Z= 2.53)*  03/10/17 266 lb (120.7 kg) (>99%, Z= 2.57)*   * Growth percentiles are based on CDC (Girls, 2-20 Years) data.     Physical Exam Vitals and nursing note reviewed.  Constitutional:      General: She is not in acute distress.    Appearance: She is well-developed.  Cardiovascular:     Rate and Rhythm: Normal rate and regular rhythm.  Pulmonary:     Effort: Pulmonary effort is normal.     Breath sounds: Normal breath sounds.  Neurological:     Mental Status: She is alert and oriented to person, place, and time.       Assessment & Plan:   Primary hypertension -     amLODIPine Besylate; Take 1 tablet (5 mg total) by mouth daily.  Dispense: 30 tablet; Refill: 2 -     Metoprolol Succinate ER; Take  1 tablet (25 mg total) by mouth daily.  Dispense: 30 tablet; Refill: 2     Return in about 6 months (around 10/06/2023).   Ivonne Andrew, NP 04/08/2023

## 2023-04-08 NOTE — Patient Instructions (Signed)

## 2023-05-17 ENCOUNTER — Other Ambulatory Visit: Payer: Self-pay | Admitting: Nurse Practitioner

## 2023-05-17 DIAGNOSIS — I10 Essential (primary) hypertension: Secondary | ICD-10-CM

## 2023-10-07 ENCOUNTER — Ambulatory Visit (INDEPENDENT_AMBULATORY_CARE_PROVIDER_SITE_OTHER): Payer: Self-pay | Admitting: Nurse Practitioner

## 2023-10-07 ENCOUNTER — Encounter: Payer: Self-pay | Admitting: Nurse Practitioner

## 2023-10-07 VITALS — BP 152/88 | HR 86 | Temp 98.0°F | Wt 296.6 lb

## 2023-10-07 DIAGNOSIS — D229 Melanocytic nevi, unspecified: Secondary | ICD-10-CM

## 2023-10-07 DIAGNOSIS — Z1329 Encounter for screening for other suspected endocrine disorder: Secondary | ICD-10-CM | POA: Diagnosis not present

## 2023-10-07 DIAGNOSIS — Z13220 Encounter for screening for lipoid disorders: Secondary | ICD-10-CM

## 2023-10-07 DIAGNOSIS — I10 Essential (primary) hypertension: Secondary | ICD-10-CM | POA: Diagnosis not present

## 2023-10-07 MED ORDER — AMLODIPINE BESYLATE 5 MG PO TABS
5.0000 mg | ORAL_TABLET | Freq: Every day | ORAL | 1 refills | Status: AC
Start: 2023-10-07 — End: ?

## 2023-10-07 MED ORDER — METOPROLOL SUCCINATE ER 25 MG PO TB24
25.0000 mg | ORAL_TABLET | Freq: Every day | ORAL | 1 refills | Status: AC
Start: 2023-10-07 — End: ?

## 2023-10-07 NOTE — Progress Notes (Signed)
 Subjective   Patient ID: Danielle Dunlap, female    DOB: 2000/03/31, 23 y.o.   MRN: [id number]  Chief Complaint  Patient presents with   Hypertension    Patient stated that her BP has been good    Referring provider: No ref. provider found  Danielle Dunlap is a 23 y.o. female with Past Medical History: No date: Hypertension No date: Nosebleed   HPI  Patient is following up on hypertension today.  She is compliant with medications.  Blood pressure stable in office today.  Overall patient is tolerating medications well.  Patient does need refills on medications.  Patient does need labs today.  Patient will need a referral placed to dermatology for atypical moles.  This was placed at last visit but patient was unable to get in contact with the office to schedule the appointment.  Denies f/c/s, n/v/d, hemoptysis, PND, leg swelling. Denies chest pain or edema.     No Known Allergies  Immunization History  Administered Date(s) Administered   HPV 9-valent 10/23/2016, 03/10/2017, 05/13/2017, 06/09/2017   Influenza,inj,Quad PF,6+ Mos 10/23/2016   PFIZER(Purple Top)SARS-COV-2 Vaccination 05/06/2019, 05/31/2019    Tobacco History: Social History   Tobacco Use  Smoking Status Never  Smokeless Tobacco Never   Counseling given: Not Answered   Outpatient Encounter Medications as of 10/07/2023  Medication Sig   [DISCONTINUED] amLODipine  (NORVASC ) 5 MG tablet TAKE 1 TABLET (5 MG TOTAL) BY MOUTH DAILY.   [DISCONTINUED] metoprolol  succinate (TOPROL -XL) 25 MG 24 hr tablet TAKE 1 TABLET (25 MG TOTAL) BY MOUTH DAILY.   amLODipine  (NORVASC ) 5 MG tablet Take 1 tablet (5 mg total) by mouth daily.   ibuprofen (ADVIL,MOTRIN) 400 MG tablet Take 400 mg by mouth every 6 (six) hours as needed for moderate pain. (Patient not taking: Reported on 04/08/2023)   metoprolol  succinate (TOPROL -XL) 25 MG 24 hr tablet Take 1 tablet (25 mg total) by mouth daily.   norgestimate -ethinyl estradiol   (ORTHO-CYCLEN,SPRINTEC,PREVIFEM) 0.25-35 MG-MCG tablet Take 1 tablet by mouth daily. Take 2 active pills daily for first week, then one active pill daily for the rest of three packs. (Patient not taking: Reported on 04/08/2023)   No facility-administered encounter medications on file as of 10/07/2023.    Review of Systems  Review of Systems  Constitutional: Negative.   HENT: Negative.    Cardiovascular: Negative.   Gastrointestinal: Negative.   Allergic/Immunologic: Negative.   Neurological: Negative.   Psychiatric/Behavioral: Negative.       Objective:   BP (!) 152/88   Pulse 86   Temp 98 F (36.7 C) (Oral)   Wt 296 lb 9.6 oz (134.5 kg)   SpO2 99%   BMI 54.25 kg/m   Wt Readings from Last 5 Encounters:  10/07/23 296 lb 9.6 oz (134.5 kg)  04/08/23 294 lb 6.4 oz (133.5 kg)  12/31/22 276 lb (125.2 kg)  12/03/22 285 lb (129.3 kg)  04/07/17 259 lb 12.8 oz (117.8 kg) (>99%, Z= 2.53)*   * Growth percentiles are based on CDC (Girls, 2-20 Years) data.     Physical Exam Vitals and nursing note reviewed.  Constitutional:      General: She is not in acute distress.    Appearance: She is well-developed.  Cardiovascular:     Rate and Rhythm: Normal rate and regular rhythm.  Pulmonary:     Effort: Pulmonary effort is normal.     Breath sounds: Normal breath sounds.  Neurological:     Mental Status: She is alert and  oriented to person, place, and time.       Assessment & Plan:   Thyroid disorder screen -     TSH  Primary hypertension -     amLODIPine  Besylate; Take 1 tablet (5 mg total) by mouth daily.  Dispense: 90 tablet; Refill: 1 -     Metoprolol  Succinate ER; Take 1 tablet (25 mg total) by mouth daily.  Dispense: 90 tablet; Refill: 1 -     CBC -     Comprehensive metabolic panel with GFR  Lipid screening -     Lipid panel  Atypical mole -     Ambulatory referral to Dermatology     Return in about 6 months (around 04/08/2024).   Bascom GORMAN Borer,  NP 10/07/2023

## 2023-10-08 ENCOUNTER — Ambulatory Visit: Payer: Self-pay | Admitting: Nurse Practitioner

## 2023-10-08 LAB — COMPREHENSIVE METABOLIC PANEL WITH GFR
ALT: 15 IU/L (ref 0–32)
AST: 17 IU/L (ref 0–40)
Albumin: 4 g/dL (ref 4.0–5.0)
Alkaline Phosphatase: 96 IU/L (ref 44–121)
BUN/Creatinine Ratio: 17 (ref 9–23)
BUN: 11 mg/dL (ref 6–20)
Bilirubin Total: 0.2 mg/dL (ref 0.0–1.2)
CO2: 21 mmol/L (ref 20–29)
Calcium: 8.7 mg/dL (ref 8.7–10.2)
Chloride: 104 mmol/L (ref 96–106)
Creatinine, Ser: 0.66 mg/dL (ref 0.57–1.00)
Globulin, Total: 2.8 g/dL (ref 1.5–4.5)
Glucose: 84 mg/dL (ref 70–99)
Potassium: 4 mmol/L (ref 3.5–5.2)
Sodium: 139 mmol/L (ref 134–144)
Total Protein: 6.8 g/dL (ref 6.0–8.5)
eGFR: 125 mL/min/1.73 (ref >59)

## 2023-10-08 LAB — LIPID PANEL
Chol/HDL Ratio: 4.4 ratio (ref 0.0–4.4)
Cholesterol, Total: 198 mg/dL (ref 100–199)
HDL: 45 mg/dL (ref >39)
LDL Chol Calc (NIH): 133 mg/dL — ABNORMAL HIGH (ref 0–99)
Triglycerides: 113 mg/dL (ref 0–149)
VLDL Cholesterol Cal: 20 mg/dL (ref 5–40)

## 2023-10-08 LAB — CBC
Hematocrit: 35.6 % (ref 34.0–46.6)
Hemoglobin: 10.9 g/dL — ABNORMAL LOW (ref 11.1–15.9)
MCH: 24.4 pg — ABNORMAL LOW (ref 26.6–33.0)
MCHC: 30.6 g/dL — ABNORMAL LOW (ref 31.5–35.7)
MCV: 80 fL (ref 79–97)
Platelets: 271 x10E3/uL (ref 150–450)
RBC: 4.47 x10E6/uL (ref 3.77–5.28)
RDW: 15.6 % — ABNORMAL HIGH (ref 11.7–15.4)
WBC: 5.4 x10E3/uL (ref 3.4–10.8)

## 2023-10-08 LAB — TSH: TSH: 4.24 u[IU]/mL (ref 0.450–4.500)

## 2024-01-13 ENCOUNTER — Encounter: Payer: Self-pay | Admitting: Nurse Practitioner

## 2024-01-13 ENCOUNTER — Ambulatory Visit: Payer: Self-pay | Admitting: Nurse Practitioner

## 2024-01-13 VITALS — BP 156/90 | HR 90 | Wt 284.0 lb

## 2024-01-13 DIAGNOSIS — I10 Essential (primary) hypertension: Secondary | ICD-10-CM | POA: Diagnosis not present

## 2024-01-13 DIAGNOSIS — D509 Iron deficiency anemia, unspecified: Secondary | ICD-10-CM | POA: Diagnosis not present

## 2024-01-13 NOTE — Progress Notes (Addendum)
   Subjective   Patient ID: Danielle Dunlap, female    DOB: 2000-05-21, 23 y.o.   MRN: [id number]  Chief Complaint  Patient presents with   Follow-up    No concerns today    Referring provider: No ref. provider found  Danielle Dunlap is a 23 y.o. female with Past Medical History: No date: Hypertension No date: Nosebleed   HPI  Patient is following up on hypertension today.  She is compliant with medications.  Blood pressure stable in office today.  Overall patient is tolerating medications well. Denies f/c/s, n/v/d, hemoptysis, PND, leg swelling. Denies chest pain or edema.   Note: Blood pressure was elevated in office today.  Patient did not take blood pressure medicine today but will take when she gets home.  No Known Allergies  Immunization History  Administered Date(s) Administered   HPV 9-valent 10/23/2016, 03/10/2017, 05/13/2017, 06/09/2017   Influenza,inj,Quad PF,6+ Mos 10/23/2016   PFIZER(Purple Top)SARS-COV-2 Vaccination 05/06/2019, 05/31/2019    Tobacco History: Social History   Tobacco Use  Smoking Status Never  Smokeless Tobacco Never   Counseling given: Not Answered   Outpatient Encounter Medications as of 01/13/2024  Medication Sig   amLODipine  (NORVASC ) 5 MG tablet Take 1 tablet (5 mg total) by mouth daily.   metoprolol  succinate (TOPROL -XL) 25 MG 24 hr tablet Take 1 tablet (25 mg total) by mouth daily.   ibuprofen (ADVIL,MOTRIN) 400 MG tablet Take 400 mg by mouth every 6 (six) hours as needed for moderate pain. (Patient not taking: Reported on 01/13/2024)   norgestimate -ethinyl estradiol  (ORTHO-CYCLEN,SPRINTEC,PREVIFEM) 0.25-35 MG-MCG tablet Take 1 tablet by mouth daily. Take 2 active pills daily for first week, then one active pill daily for the rest of three packs. (Patient not taking: Reported on 01/13/2024)   No facility-administered encounter medications on file as of 01/13/2024.    Review of Systems  Review of Systems  Constitutional: Negative.    HENT: Negative.    Cardiovascular: Negative.   Gastrointestinal: Negative.   Allergic/Immunologic: Negative.   Neurological: Negative.   Psychiatric/Behavioral: Negative.       Objective:   BP (!) 153/93 (BP Location: Left Arm, Patient Position: Standing, Cuff Size: Large)   Pulse 88   Wt 284 lb (128.8 kg)   SpO2 98%   BMI 51.94 kg/m   Wt Readings from Last 5 Encounters:  01/13/24 284 lb (128.8 kg)  10/07/23 296 lb 9.6 oz (134.5 kg)  04/08/23 294 lb 6.4 oz (133.5 kg)  12/31/22 276 lb (125.2 kg)  12/03/22 285 lb (129.3 kg)     Physical Exam Vitals and nursing note reviewed.  Constitutional:      General: She is not in acute distress.    Appearance: She is well-developed.  Cardiovascular:     Rate and Rhythm: Normal rate and regular rhythm.  Pulmonary:     Effort: Pulmonary effort is normal.     Breath sounds: Normal breath sounds.  Neurological:     Mental Status: She is alert and oriented to person, place, and time.       Assessment & Plan:   Iron deficiency anemia, unspecified iron deficiency anemia type -     CBC  Primary hypertension -     CBC     Return in about 6 months (around 07/13/2024).   Bascom GORMAN Borer, NP 01/13/2024

## 2024-01-14 ENCOUNTER — Ambulatory Visit: Payer: Self-pay | Admitting: Nurse Practitioner

## 2024-01-14 LAB — CBC
Hematocrit: 36 % (ref 34.0–46.6)
Hemoglobin: 10.2 g/dL — ABNORMAL LOW (ref 11.1–15.9)
MCH: 21 pg — ABNORMAL LOW (ref 26.6–33.0)
MCHC: 28.3 g/dL — ABNORMAL LOW (ref 31.5–35.7)
MCV: 74 fL — ABNORMAL LOW (ref 79–97)
Platelets: 295 x10E3/uL (ref 150–450)
RBC: 4.85 x10E6/uL (ref 3.77–5.28)
RDW: 15.1 % (ref 11.7–15.4)
WBC: 7 x10E3/uL (ref 3.4–10.8)

## 2024-07-15 ENCOUNTER — Ambulatory Visit: Payer: Self-pay | Admitting: Nurse Practitioner
